# Patient Record
Sex: Female | Born: 1980 | Race: White | Hispanic: No | Marital: Single | Smoking: Former smoker
Health system: Southern US, Community
[De-identification: ages and names within clinical notes are randomized; demographics above are authoritative.]

## PROBLEM LIST (undated history)

## (undated) DIAGNOSIS — L509 Urticaria, unspecified: Secondary | ICD-10-CM

## (undated) DIAGNOSIS — J069 Acute upper respiratory infection, unspecified: Secondary | ICD-10-CM

## (undated) HISTORY — DX: Urticaria, unspecified: L50.9

## (undated) HISTORY — DX: Acute upper respiratory infection, unspecified: J06.9

---
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
## 2006-09-29 ENCOUNTER — Emergency Department (HOSPITAL_COMMUNITY): Admission: EM | Admit: 2006-09-29 | Discharge: 2006-09-29 | Payer: Self-pay | Admitting: Emergency Medicine

## 2008-08-26 ENCOUNTER — Encounter: Admission: RE | Admit: 2008-08-26 | Discharge: 2008-08-26 | Payer: Self-pay | Admitting: Gastroenterology

## 2008-12-14 ENCOUNTER — Emergency Department (HOSPITAL_COMMUNITY): Admission: EM | Admit: 2008-12-14 | Discharge: 2008-12-14 | Payer: Self-pay | Admitting: Emergency Medicine

## 2010-08-15 ENCOUNTER — Other Ambulatory Visit: Admission: RE | Admit: 2010-08-15 | Discharge: 2010-08-15 | Payer: Self-pay | Admitting: Family Medicine

## 2011-01-01 ENCOUNTER — Emergency Department (HOSPITAL_BASED_OUTPATIENT_CLINIC_OR_DEPARTMENT_OTHER)
Admission: EM | Admit: 2011-01-01 | Discharge: 2011-01-01 | Disposition: A | Discharge status: Home / Self Care | Payer: Managed Care, Other (non HMO) | Attending: Emergency Medicine | Admitting: Emergency Medicine

## 2011-01-01 DIAGNOSIS — N200 Calculus of kidney: Secondary | ICD-10-CM | POA: Insufficient documentation

## 2011-01-01 LAB — URINE MICROSCOPIC-ADD ON

## 2011-01-01 LAB — BASIC METABOLIC PANEL
Calcium: 9.3 mg/dL (ref 8.4–10.5)
Chloride: 107 mEq/L (ref 96–112)
GFR calc Af Amer: >60 mL/min (ref >60)
GFR calc non Af Amer: >60 mL/min (ref >60)
Glucose, Bld: 107 mg/dL — ABNORMAL HIGH (ref 70–99)

## 2011-01-01 LAB — URINALYSIS, ROUTINE W REFLEX MICROSCOPIC
Bilirubin Urine: NEGATIVE
Ketones, ur: NEGATIVE mg/dL
Leukocytes, UA: NEGATIVE
Urobilinogen, UA: 0.2 mg/dL (ref 0.0–1.0)

## 2011-01-01 LAB — PREGNANCY, URINE: Preg Test, Ur: NEGATIVE

## 2011-01-03 LAB — URINE CULTURE
Colony Count: 60000
Culture  Setup Time: 201203181201

## 2011-01-26 LAB — URINALYSIS, ROUTINE W REFLEX MICROSCOPIC: Specific Gravity, Urine: 1.018 (ref 1.005–1.030)

## 2011-01-26 LAB — URINE CULTURE: Colony Count: NO GROWTH

## 2011-02-22 ENCOUNTER — Other Ambulatory Visit: Payer: Self-pay | Admitting: Family Medicine

## 2011-02-22 DIAGNOSIS — R1011 Right upper quadrant pain: Secondary | ICD-10-CM

## 2011-02-28 ENCOUNTER — Ambulatory Visit
Admission: RE | Admit: 2011-02-28 | Discharge: 2011-02-28 | Disposition: A | Discharge status: Home / Self Care | Payer: Managed Care, Other (non HMO) | Source: Ambulatory Visit | Attending: Family Medicine | Admitting: Family Medicine

## 2011-02-28 DIAGNOSIS — R1011 Right upper quadrant pain: Secondary | ICD-10-CM

## 2011-07-19 IMAGING — US US ABDOMEN COMPLETE
1 series · 14 of 25 positions shown · non-contrast
Comparison: Abdominal CT 08/26/2008.

CLINICAL DATA: 29-year-old with epigastric and right upper
quadrant abdominal pain.

COMPLETE ABDOMINAL ULTRASOUND

[Series 1: us abdomen complete · 0.20mm/px · 14 of 84 slices shown]
[im 1/84]
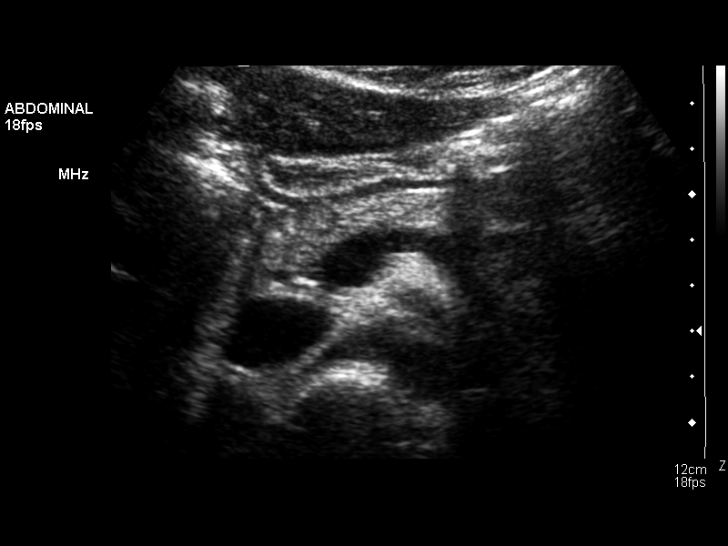
[im 7/84]
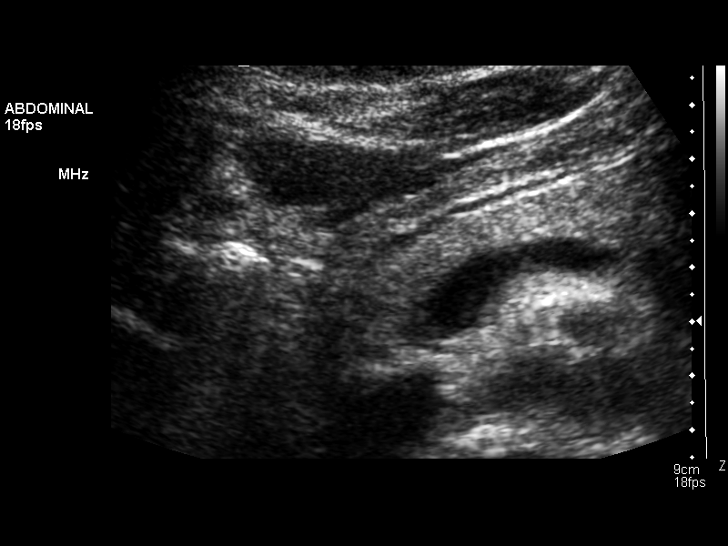
[im 14/84]
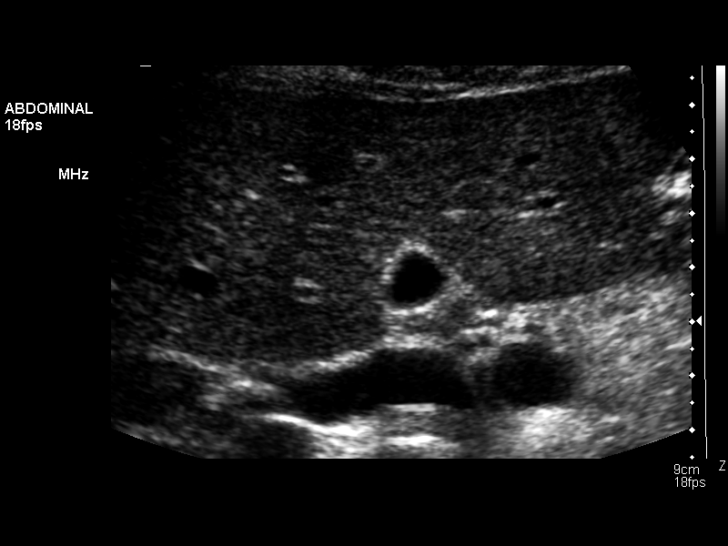
[im 21/84]
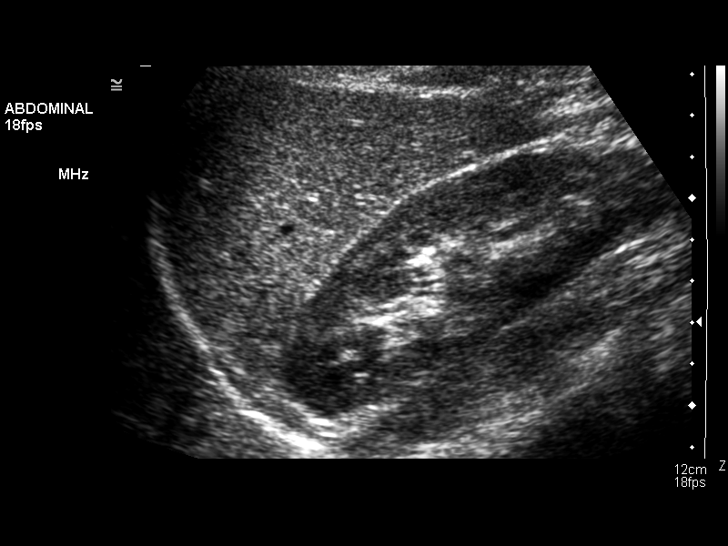
[im 28/84]
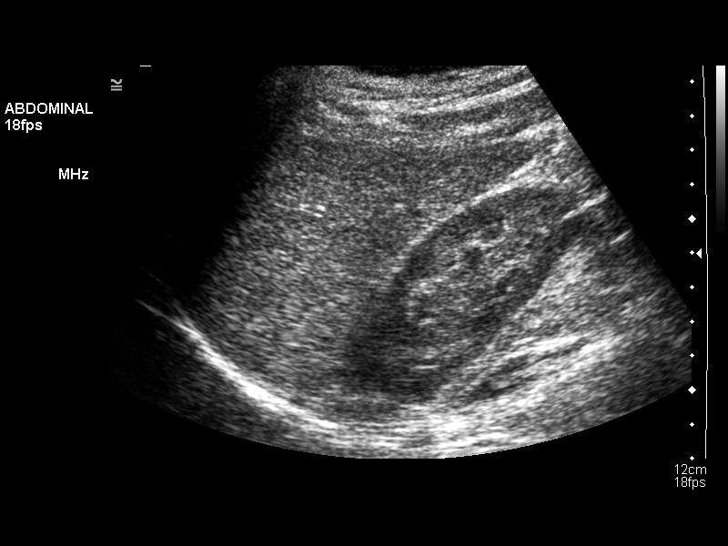
[im 32/84]
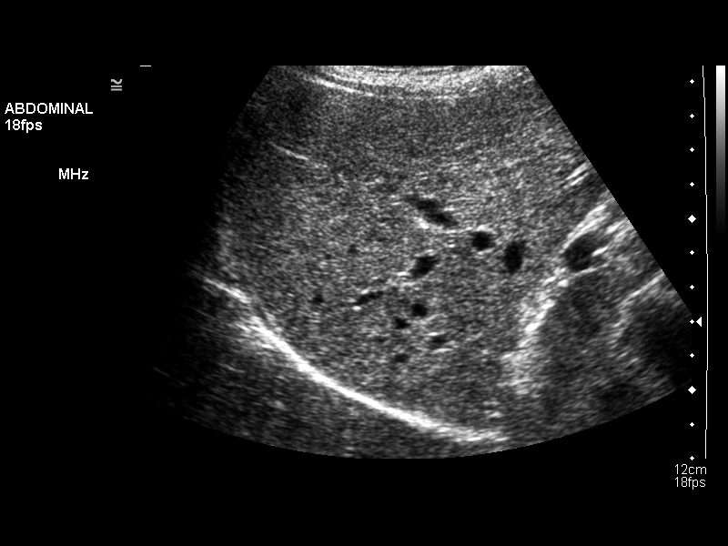
[im 39/84]
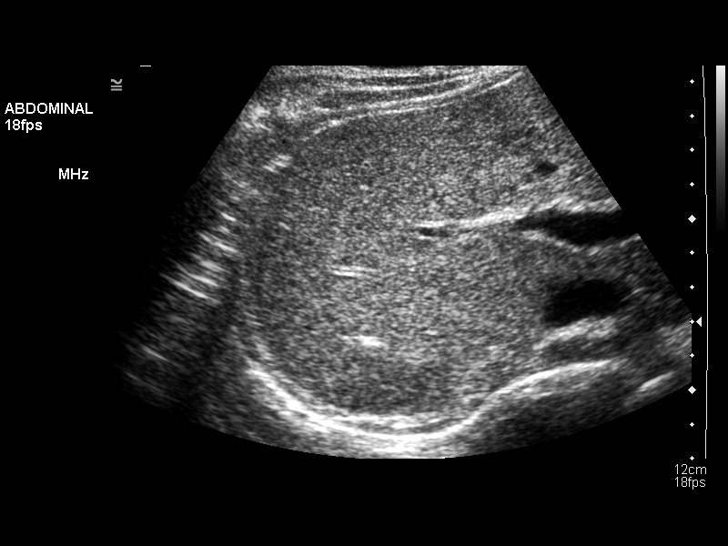
[im 45/84]
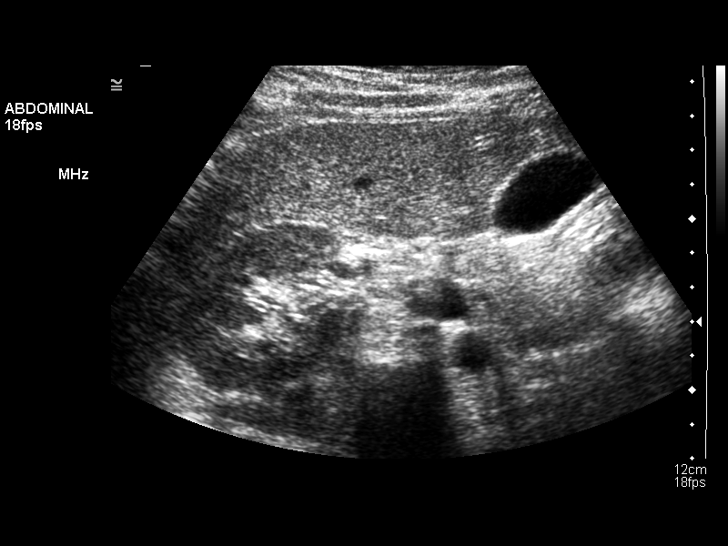
[im 52/84]
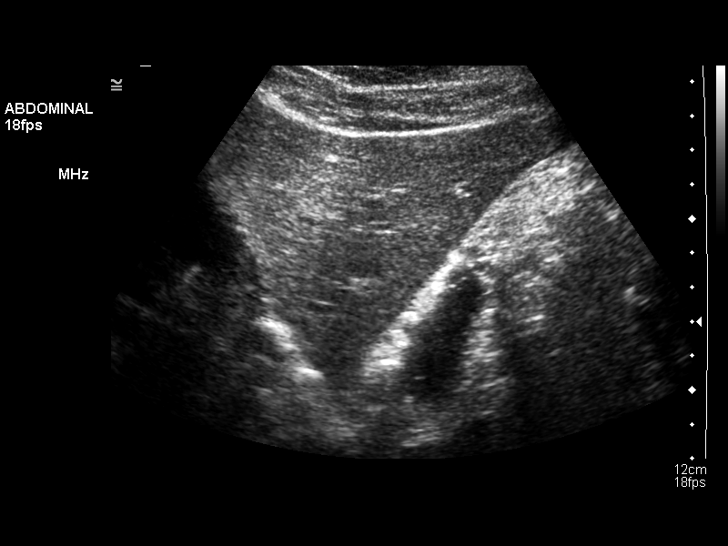
[im 56/84]
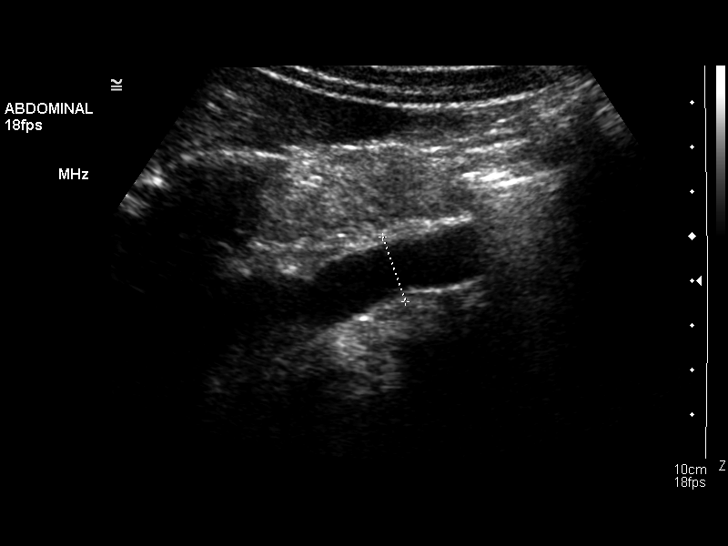
[im 63/84]
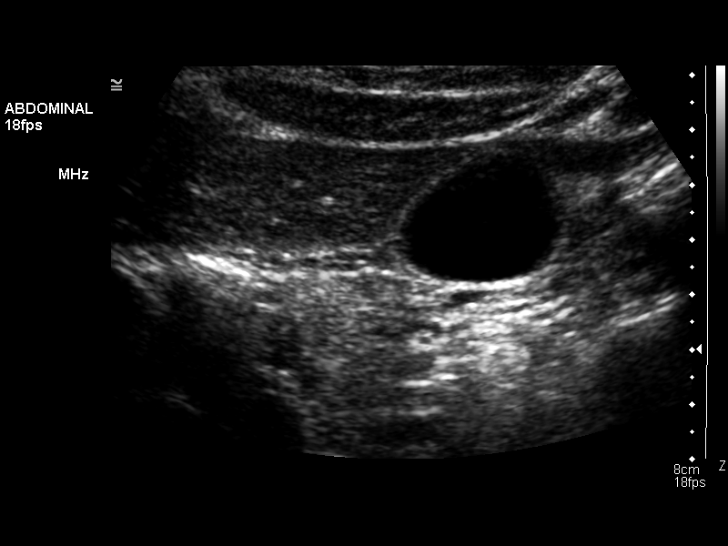
[im 70/84]
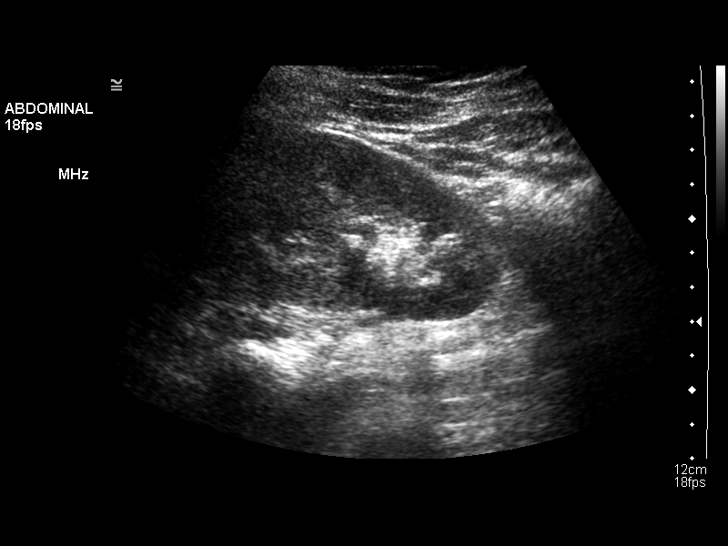
[im 77/84]
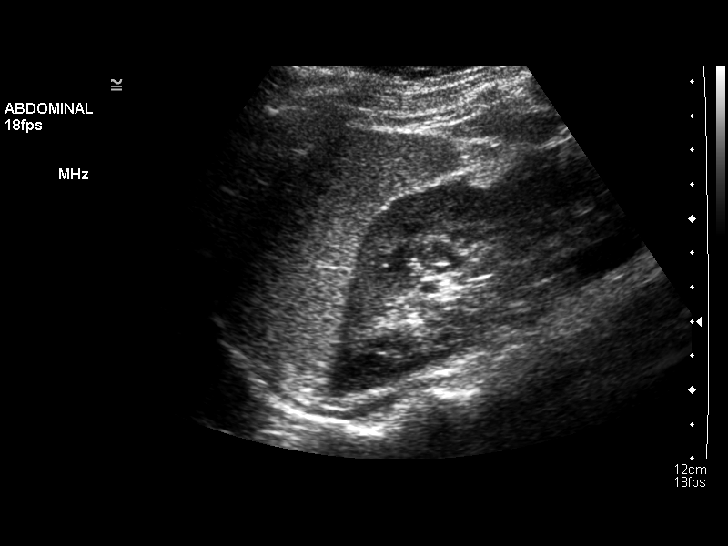
[im 84/84]
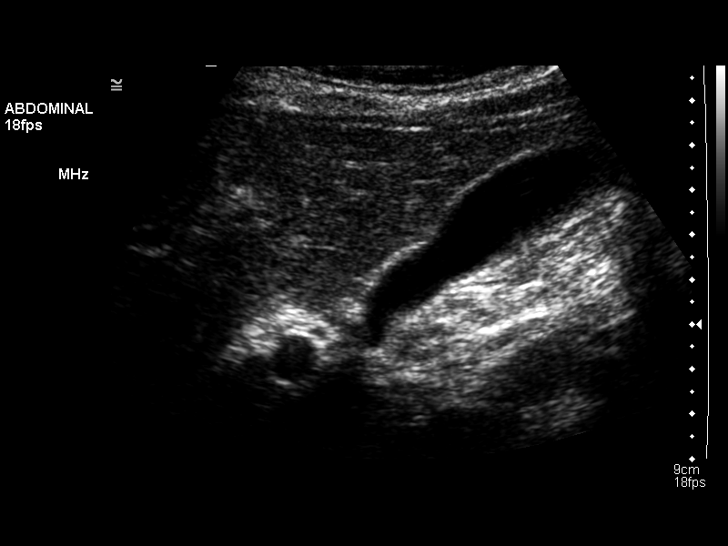

[14 of 25 positions shown; findings below may reference images not displayed]

FINDINGS: Gallbladder: Well distended without wall thickening, stones or
pericholecystic fluid. Negative sonographic Murphy's sign.

Common bile duct:   Normal in caliber without filling defects.

Liver:  Echogenicity is within normal limits.  No focal hepatic
abnormalities are identified.

IVC:  Visualized portions appear unremarkable.

Pancreas:  Visualized portions appear unremarkable.

Spleen:  Visualized portions appear unremarkable.

Right Kidney:   The renal cortical thickness and echogenicity are
preserved.  There is no hydronephrosis or focal abnormality. Renal
length is 11.4 cm.

Left Kidney:   The renal cortical thickness and echogenicity are
preserved.  There is no hydronephrosis or focal abnormality. Renal
length is 11.7 cm.

Abdominal aorta:  Visualized portions appear unremarkable.
IMPRESSION: Normal abdominal ultrasound.

## 2020-05-30 DIAGNOSIS — Z20822 Contact with and (suspected) exposure to covid-19: Secondary | ICD-10-CM | POA: Diagnosis not present

## 2020-05-31 DIAGNOSIS — Z20822 Contact with and (suspected) exposure to covid-19: Secondary | ICD-10-CM | POA: Diagnosis not present

## 2020-06-09 DIAGNOSIS — Z20822 Contact with and (suspected) exposure to covid-19: Secondary | ICD-10-CM | POA: Diagnosis not present

## 2020-07-22 ENCOUNTER — Other Ambulatory Visit: Payer: Self-pay | Admitting: Family Medicine

## 2020-07-22 ENCOUNTER — Other Ambulatory Visit (HOSPITAL_COMMUNITY)
Admission: RE | Admit: 2020-07-22 | Discharge: 2020-07-22 | Disposition: A | Discharge status: Home / Self Care | Payer: BC Managed Care – PPO | Source: Ambulatory Visit | Attending: Family Medicine | Admitting: Family Medicine

## 2020-07-22 DIAGNOSIS — Z13220 Encounter for screening for lipoid disorders: Secondary | ICD-10-CM | POA: Diagnosis not present

## 2020-07-22 DIAGNOSIS — Z124 Encounter for screening for malignant neoplasm of cervix: Secondary | ICD-10-CM | POA: Insufficient documentation

## 2020-07-22 DIAGNOSIS — Z23 Encounter for immunization: Secondary | ICD-10-CM | POA: Diagnosis not present

## 2020-07-22 DIAGNOSIS — Z0000 Encounter for general adult medical examination without abnormal findings: Secondary | ICD-10-CM | POA: Diagnosis not present

## 2020-07-22 DIAGNOSIS — E559 Vitamin D deficiency, unspecified: Secondary | ICD-10-CM | POA: Diagnosis not present

## 2020-07-22 DIAGNOSIS — Z113 Encounter for screening for infections with a predominantly sexual mode of transmission: Secondary | ICD-10-CM | POA: Diagnosis not present

## 2020-07-26 LAB — CYTOLOGY - PAP
Comment: NEGATIVE
Diagnosis: NEGATIVE
High risk HPV: NEGATIVE

## 2020-08-03 DIAGNOSIS — M545 Low back pain, unspecified: Secondary | ICD-10-CM | POA: Diagnosis not present

## 2020-08-03 DIAGNOSIS — M25512 Pain in left shoulder: Secondary | ICD-10-CM | POA: Diagnosis not present

## 2020-08-03 DIAGNOSIS — G8929 Other chronic pain: Secondary | ICD-10-CM | POA: Diagnosis not present

## 2020-08-03 DIAGNOSIS — M25511 Pain in right shoulder: Secondary | ICD-10-CM | POA: Diagnosis not present

## 2020-08-17 DIAGNOSIS — M25512 Pain in left shoulder: Secondary | ICD-10-CM | POA: Diagnosis not present

## 2020-08-17 DIAGNOSIS — M25511 Pain in right shoulder: Secondary | ICD-10-CM | POA: Diagnosis not present

## 2020-08-17 DIAGNOSIS — M545 Low back pain, unspecified: Secondary | ICD-10-CM | POA: Diagnosis not present

## 2020-08-24 DIAGNOSIS — M25511 Pain in right shoulder: Secondary | ICD-10-CM | POA: Diagnosis not present

## 2020-08-24 DIAGNOSIS — M25512 Pain in left shoulder: Secondary | ICD-10-CM | POA: Diagnosis not present

## 2020-08-24 DIAGNOSIS — M545 Low back pain, unspecified: Secondary | ICD-10-CM | POA: Diagnosis not present

## 2020-08-31 DIAGNOSIS — M25511 Pain in right shoulder: Secondary | ICD-10-CM | POA: Diagnosis not present

## 2020-08-31 DIAGNOSIS — M25512 Pain in left shoulder: Secondary | ICD-10-CM | POA: Diagnosis not present

## 2020-08-31 DIAGNOSIS — M545 Low back pain, unspecified: Secondary | ICD-10-CM | POA: Diagnosis not present

## 2020-09-07 DIAGNOSIS — M542 Cervicalgia: Secondary | ICD-10-CM | POA: Diagnosis not present

## 2020-09-14 DIAGNOSIS — M545 Low back pain, unspecified: Secondary | ICD-10-CM | POA: Diagnosis not present

## 2020-09-14 DIAGNOSIS — M25512 Pain in left shoulder: Secondary | ICD-10-CM | POA: Diagnosis not present

## 2020-09-14 DIAGNOSIS — M25511 Pain in right shoulder: Secondary | ICD-10-CM | POA: Diagnosis not present

## 2020-09-21 DIAGNOSIS — M545 Low back pain, unspecified: Secondary | ICD-10-CM | POA: Diagnosis not present

## 2020-09-21 DIAGNOSIS — M25512 Pain in left shoulder: Secondary | ICD-10-CM | POA: Diagnosis not present

## 2020-09-21 DIAGNOSIS — M25511 Pain in right shoulder: Secondary | ICD-10-CM | POA: Diagnosis not present

## 2020-09-30 DIAGNOSIS — M25511 Pain in right shoulder: Secondary | ICD-10-CM | POA: Diagnosis not present

## 2020-09-30 DIAGNOSIS — M25512 Pain in left shoulder: Secondary | ICD-10-CM | POA: Diagnosis not present

## 2020-09-30 DIAGNOSIS — M545 Low back pain, unspecified: Secondary | ICD-10-CM | POA: Diagnosis not present

## 2020-10-19 DIAGNOSIS — M545 Low back pain, unspecified: Secondary | ICD-10-CM | POA: Diagnosis not present

## 2020-10-19 DIAGNOSIS — M25512 Pain in left shoulder: Secondary | ICD-10-CM | POA: Diagnosis not present

## 2020-10-19 DIAGNOSIS — M25511 Pain in right shoulder: Secondary | ICD-10-CM | POA: Diagnosis not present

## 2020-11-09 DIAGNOSIS — R109 Unspecified abdominal pain: Secondary | ICD-10-CM | POA: Diagnosis not present

## 2020-11-09 DIAGNOSIS — J3089 Other allergic rhinitis: Secondary | ICD-10-CM | POA: Diagnosis not present

## 2020-12-03 DIAGNOSIS — M542 Cervicalgia: Secondary | ICD-10-CM | POA: Diagnosis not present

## 2020-12-09 DIAGNOSIS — R109 Unspecified abdominal pain: Secondary | ICD-10-CM | POA: Diagnosis not present

## 2020-12-13 DIAGNOSIS — M25512 Pain in left shoulder: Secondary | ICD-10-CM | POA: Diagnosis not present

## 2020-12-13 DIAGNOSIS — M25511 Pain in right shoulder: Secondary | ICD-10-CM | POA: Diagnosis not present

## 2020-12-13 DIAGNOSIS — M545 Low back pain, unspecified: Secondary | ICD-10-CM | POA: Diagnosis not present

## 2021-01-11 ENCOUNTER — Ambulatory Visit (INDEPENDENT_AMBULATORY_CARE_PROVIDER_SITE_OTHER): Payer: BC Managed Care – PPO | Admitting: Allergy & Immunology

## 2021-01-11 ENCOUNTER — Encounter: Payer: Self-pay | Admitting: Allergy & Immunology

## 2021-01-11 ENCOUNTER — Other Ambulatory Visit: Payer: Self-pay

## 2021-01-11 VITALS — BP 106/60 | HR 72 | Temp 98.5°F | Resp 14 | Ht 61.0 in | Wt 125.4 lb

## 2021-01-11 DIAGNOSIS — L239 Allergic contact dermatitis, unspecified cause: Secondary | ICD-10-CM

## 2021-01-11 DIAGNOSIS — H1045 Other chronic allergic conjunctivitis: Secondary | ICD-10-CM | POA: Diagnosis not present

## 2021-01-11 DIAGNOSIS — H109 Unspecified conjunctivitis: Secondary | ICD-10-CM | POA: Insufficient documentation

## 2021-01-11 DIAGNOSIS — J3089 Other allergic rhinitis: Secondary | ICD-10-CM

## 2021-01-11 MED ORDER — ZERVIATE 0.24 % OP SOLN: 1.0000 [drp] | Freq: Two times a day (BID) | OPHTHALMIC | 5 refills | Status: DC

## 2021-01-11 NOTE — Patient Instructions (Addendum)
1. Perennial allergic rhinitis - Testing was positive to indoor molds. - Avoidance measures provided. - Copy of testing results provided. - Continue with cetirizine 10 mg, but increase to 1 tablet twice daily. - Samples of Zerviate eye drops provided two use 1 drop per eye twice daily.  2. Possible contact dermatitis - Make an appointment for patch testing. - We use a panel that test for the most common chemical sensitizers. - You can also bring in your cosmetics from home and we can test those as well. - These patches are placed on a Monday and read on a Wednesday and Friday. - You can remain on the antihistamines during this time.  3. Multiple food intolerances - None of your reactions were concerning for anaphylaxis.  - Therefore, no testing today for foods was indicated. - Continue to avoid elevated triggering foods.  4. Return in about 2 weeks (around 01/25/2021) for [facility] TESTING.    Please inform us of any Emergency Department visits, hospitalizations, or changes in symptoms. Call us before going to the ED for breathing or allergy symptoms since we might be able to fit you in for a sick visit. Feel free to contact us anytime with any questions, problems, or concerns.  It was a pleasure to meet you today!  Websites that have reliable patient information: 1. American Academy of Asthma, Allergy, and Immunology: www.aaaai.org 2. Food Allergy Research and Education (FARE): foodallergy.org 3. Mothers of Asthmatics: http://www.asthmacommunitynetwork.org 4. American College of Allergy, Asthma, and Immunology: www.acaai.org   COVID-19 Vaccine Information can be found at: [email or web address] For questions related to vaccine distribution or appointments, please email [email or web address] or call [phone].   We realize that you might be concerned about having an allergic reaction to the COVID19 vaccines. To help with that  concern, WE ARE OFFERING THE COVID19 VACCINES IN OUR OFFICE! Ask the front desk for dates!     "Like" [place] on Facebook and Instagram for our latest updates!      A healthy democracy works best when [employer] participate! Make sure you are registered to vote! If you have moved or changed any of your contact information, you will need to get this updated before voting!  In some cases, you MAY be able to register to vote online: [email or web address]   1. Control-Buffer 50% Glycerol Negative   2. Control-Histamine 1 mg/ml 2+   3. Albumin saline Negative   4. Bahia Negative   5. French Southern Territories Negative   6. Johnson Negative   7. Kentucky Blue Negative   8. Meadow Fescue Negative   9. Perennial Rye Negative   10. Sweet Vernal Negative   11. Timothy Negative   12. Cocklebur Negative   13. Burweed Marshelder Negative   14. Ragweed, short Negative   15. Ragweed, Giant Negative   16. Plantain,  English Negative   17. Lamb's Quarters Negative   18. Sheep Sorrell Negative   19. Rough Pigweed Negative   20. Marsh Elder, Rough Negative   21. Mugwort, Common Negative   22. Ash mix Negative   23. Birch mix Negative   24. Beech American Negative   25. Box, Elder Negative   26. Cedar, red Negative   27. Cottonwood, Guinea-Bissau Negative   28. Elm mix Negative   29. Hickory Negative   30. Maple mix Negative   31. Oak, Guinea-Bissau mix Negative   32. Pecan Pollen Negative   33. Pine mix Negative   34. Sycamore Eastern Negative   35. Walnut, Black Pollen  Negative   36. Alternaria alternata Negative   37. Cladosporium Herbarum Negative   38. Aspergillus mix Negative   39. Penicillium mix Negative   40. Bipolaris sorokiniana (Helminthosporium) Negative   41. Drechslera spicifera (Curvularia) Negative   42. Mucor plumbeus Negative   43. Fusarium moniliforme Negative   44. Aureobasidium pullulans (pullulara) Negative   45. Rhizopus oryzae Negative   46. Botrytis cinera  Negative   47. Epicoccum nigrum Negative   48. Phoma betae Negative   49. Candida Albicans Negative   50. Trichophyton mentagrophytes Negative   51. Mite, D Farinae  5,000 AU/ml Negative   52. Mite, D Pteronyssinus  5,000 AU/ml Negative   53. Cat Hair 10,000 BAU/ml Negative   54.  Dog Epithelia Negative   55. Mixed Feathers Negative   56. Horse Epithelia Negative   57. Cockroach, German Negative   58. Mouse Negative   59. Tobacco Leaf Negative      Control Negative   French Southern Territories Negative   Johnson Negative   7 Grass Negative   Ragweed mix Negative   Weed mix Negative   Tree mix Negative   Mold 1 Negative   Mold 2 3+   Mold 3 Negative   Mold 4 2+   Cat Negative   Dog Negative   Cockroach Negative   Mite mix Negative     Control of Mold Allergen   Mold and fungi can grow on a variety of surfaces provided certain temperature and moisture conditions exist.  Outdoor molds grow on plants, decaying vegetation and soil.  The major outdoor mold, Alternaria and Cladosporium, are found in very high numbers during hot and dry conditions.  Generally, a late Summer - Fall peak is seen for common outdoor fungal spores.  Rain will temporarily lower outdoor mold spore count, but counts rise rapidly when the rainy period ends.  The most important indoor molds are Aspergillus and Penicillium.  Dark, humid and poorly ventilated basements are ideal sites for mold growth.  The next most common sites of mold growth are the bathroom and the kitchen.   Indoor (Perennial) Mold Control   Positive indoor molds via skin testing: Aspergillus, Penicillium, Fusarium, Aureobasidium (Pullulara) and Rhizopus  1. Maintain humidity below 50%. 2. Clean washable surfaces with 5% bleach solution. 3. Remove sources e.g. contaminated carpets.

## 2021-01-11 NOTE — Progress Notes (Signed)
NEW PATIENT  Date of Service/Encounter:  01/11/21  Referring provider: Wilfrid Lund, PA   Assessment:   Perennial allergic rhinitis (indoor molds)  Chronic allergic conjunctivitis of both eyes  Allergic contact dermatis - recommended patch testing   Unfortunately, her testing today does not correlate with her symptoms.  She might have some mold in her home, but her eye issues do not seem to be any worse there than they are when she is out and about.  In particular, they tend to worsen when she is outdoors.  We are going to get patch testing to see if maybe she is reacting to one of her facial cleansers or make-up.  I do not think this is related to her contacts, as she has gone a few days without the contacts and has not had any worsening symptoms.  We are going to up her cetirizine to see if this helps at all.  We also gave her a sample of an eyedrop Zerviate to see if this helps at all.  Plan/Recommendations:   1. Perennial allergic rhinitis - Testing was positive to indoor molds. - Avoidance measures provided. - Copy of testing results provided. - Continue with cetirizine 10 mg, but increase to 1 tablet twice daily. - Samples of Zerviate eye drops provided two use 1 drop per eye twice daily.  2. Possible contact dermatitis - Make an appointment for patch testing. - We use a panel that test for the most common chemical sensitizers. - You can also bring in your cosmetics from home and we can test those as well. - These patches are placed on a Monday and read on a Wednesday and Friday. - You can remain on the antihistamines during this time.  3. Multiple food intolerances - None of your reactions were concerning for anaphylaxis.  - Therefore, no testing today for foods was indicated. - Continue to avoid elevated triggering foods.  4. Return in about 2 weeks (around 01/25/2021) for [facility] TESTING.    Subjective:   Eileen Cherry is a 40 y.o. female presenting today for  evaluation of  Chief Complaint  Patient presents with  . Allergic Reaction    Diagnosed with allergies when she was 40 years old. Eyes watery and burning, runny nose. Allergies cause her daily routine to be interrupted. Sneezing and coughing, itchy skin.     [name] has a history of the following: Patient Active Problem List   Diagnosis Date Noted  . Perennial allergic rhinitis 01/11/2021  . Conjunctivitis 01/11/2021    History obtained from: chart review and patient.  Denice Bors was referred by Wilfrid Lund, PA.     Eileen Cherry is a 40 y.o. female presenting for an evaluation of allergies and conjnuctivitis.   Allergic Rhinitis Symptom History: She reports that she has sneezing and coughing. Her main complaint today is burning itchy watery eyes. She recently tried some eye drops. She has gotten rid of some facial lotion that burns her eyes. Her eyes have continued to burn despite having lotions. Symptoms really started late summer or early fall 2021. Symptoms continued through the winter as well. The burning is a new symptom. Eye lids are never goopy and never red. She has been on cetirizine more consistently than Pataday. This has been on board for one month consistently. She got a cat in July 2020.   She started Pataday around one month ago and she has not had a huge flare up since that time. She can go  months or weeks without anything happening. The last really bad situation was in March 2022. She denies any nasal congestion, but more with rhinorrhea. The cetirizine has helped with all of the drippiness, but there is some breakthrough symptoms. She grew up in [facility] [place] [facility] and this is where her symptoms have been the worse.   She denies any pain with eye movement.  She denies any photophobia.  She denies headache.   Food Allergy Symptom History: She has been having some GI issues. She has had negative testing in the past. She has been having diarrhea immediately after eating. There is  some burning with stooling as well and feels that she is "digesting fiberglass". She has seen GI in the past, but not recently.   She denies any sensitivity to metals.  She denies any other contact dermatitis history.  She does have some products that she uses on her face, but her conjunctivitis episodes never seem to correlate with this.  She has had episodes where she has conjunctivitis and has not been using her make-up or face wash.  She also does use contacts.  She uses daily contacts that are replaced consistently every day.  Again, she has symptoms that do not correlate with the use of her contacts.  She does use her glasses occasionally and continues to have the eye issues.  She is open to patch testing, however.  Otherwise, there is no history of other atopic diseases, including asthma, drug allergies, stinging insect allergies, eczema, urticaria or contact dermatitis. There is no significant infectious history. Vaccinations are up to date.    Past Medical History: Patient Active Problem List   Diagnosis Date Noted  . Perennial allergic rhinitis 01/11/2021  . Conjunctivitis 01/11/2021    Medication List:  Allergies as of 01/11/2021      Reactions   Amoxicillin Rash   Other reaction(s): Rash      Medication List       Accurate as of January 11, 2021 11:00 AM. If you have any questions, ask your nurse or doctor.        AZO COMPLETE FEMININE BALANCE PO Take by mouth.   cetirizine 10 MG tablet Commonly known as: ZYRTEC 1 tablet   meloxicam 15 MG tablet Commonly known as: MOBIC 1 tablet       Birth History: non-contributory  Developmental History: non-contributory  Past Surgical History: Past Surgical History:  Procedure Laterality Date  . THERAPEUTIC ABORTION  2003     Family History: History reviewed. No pertinent family history.   Social History: Eileen Cherry lives at home with her cat. She lives in a an apartment that was built in the 1970s.  There is probably  water damage.  She has carpeting throughout the home.  She has electric heating and central cooling.  There is 1 cat inside of the home who does not sleep in the bedroom.  There are no dust mite covers on the bedding.  She does have a lot of neighbors that smoke, but she does not smoke herself.  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  She does not use a HEPA filter in the home.  [place]     Review of Systems  Constitutional: Negative.  Negative for fever, malaise/fatigue and weight loss.  HENT: Negative.  Negative for congestion, ear discharge and ear pain.   Eyes: Positive for discharge and redness.  Negative for pain.  Respiratory: Negative for cough, sputum production, shortness of breath and wheezing.   Cardiovascular: Negative.  Negative for chest pain and palpitations.  Gastrointestinal: Negative for abdominal pain, diarrhea, heartburn, nausea and vomiting.  Skin: Negative.  Negative for itching and rash.  Neurological: Negative for dizziness and headaches.  Endo/Heme/Allergies: Negative for environmental allergies. Does not bruise/bleed easily.       Objective:   Blood pressure 106/60, pulse 72, temperature 98.5 F (36.9 C), resp. rate 14, height 5\' 1"  (1.549 m), weight 125 lb 6.4 oz (56.9 kg), SpO2 97 %. Body mass index is 23.69 kg/m.   Physical Exam:   Physical Exam Constitutional:      Appearance: She is well-developed.     Comments: Very pleasant female.  HENT:     Head: Normocephalic and atraumatic.     Right Ear: Tympanic membrane, ear canal and external ear normal. No drainage, swelling or tenderness. Tympanic membrane is not injected, scarred, erythematous, retracted or bulging.     Left Ear: Tympanic membrane, ear canal and external ear normal. No drainage, swelling or tenderness. Tympanic membrane is not injected, scarred, erythematous, retracted or  bulging.     Nose: No nasal deformity, septal deviation, mucosal edema or rhinorrhea.     Right Turbinates: Enlarged and swollen.     Left Turbinates: Enlarged.     Right Sinus: No maxillary sinus tenderness or frontal sinus tenderness.     Left Sinus: No maxillary sinus tenderness or frontal sinus tenderness.     Mouth/Throat:     Mouth: Mucous membranes are not pale and not dry.     Pharynx: Uvula midline.     Comments: Mild cobblestoning. Eyes:     General: Allergic shiner present.        Right eye: No discharge.        Left eye: No discharge.     Conjunctiva/sclera: Conjunctivae normal.     Right eye: Right conjunctiva is not injected. No chemosis.    Left eye: Left conjunctiva is not injected. No chemosis.    Pupils: Pupils are equal, round, and reactive to light.     Comments: Eyes appear clear today.  She does have contacts in place bilaterally.  There is no dermatitis of the eyelids or conjunctivitis.  Cardiovascular:     Rate and Rhythm: Normal rate and regular rhythm.     Heart sounds: Normal heart sounds.  Pulmonary:     Effort: Pulmonary effort is normal. No tachypnea, accessory muscle usage or respiratory distress.     Breath sounds: Normal breath sounds. No wheezing, rhonchi or rales.     Comments: Moving air well in all lung fields.  No increased work of breathing. Chest:     Chest wall: No tenderness.  Abdominal:     Tenderness: There is no abdominal tenderness. There is no guarding or rebound.  Lymphadenopathy:     Head:     Right side of head: No submandibular, tonsillar or occipital adenopathy.     Left side of head: No submandibular, tonsillar or occipital adenopathy.     Cervical: No cervical adenopathy.  Skin:    General: Skin is warm.     Capillary Refill: Capillary refill takes less than 2 seconds.     Coloration: Skin is not pale.     Findings: No abrasion, erythema, petechiae or rash. Rash is not papular, urticarial or vesicular.     Comments: She  does have rather fair skin.  Neurological:  Mental Status: She is alert.  Psychiatric:        Behavior: Behavior is cooperative.      Diagnostic studies:      Allergy Studies:     Airborne Adult Perc - 01/11/21 0935    Time Antigen Placed 0935    Allergen Manufacturer Waynette Buttery    Location Back    Number of Test 59    Panel 1 Select    1. Control-Buffer 50% Glycerol Negative    2. Control-Histamine 1 mg/ml 2+    3. Albumin saline Negative    4. Bahia Negative    5. French Southern Territories Negative    6. Johnson Negative    7. Kentucky Blue Negative    8. Meadow Fescue Negative    9. Perennial Rye Negative    10. Sweet Vernal Negative    11. Timothy Negative    12. Cocklebur Negative    13. Burweed Marshelder Negative    14. Ragweed, short Negative    15. Ragweed, Giant Negative    16. Plantain,  English Negative    17. Lamb's Quarters Negative    18. Sheep Sorrell Negative    19. Rough Pigweed Negative    20. Marsh Elder, Rough Negative    21. Mugwort, Common Negative    22. Ash mix Negative    23. Birch mix Negative    24. Beech American Negative    25. Box, Elder Negative    26. Cedar, red Negative    27. Cottonwood, Guinea-Bissau Negative    28. Elm mix Negative    29. Hickory Negative    30. Maple mix Negative    31. Oak, Guinea-[place]issau mix Negative    32. Pecan Pollen Negative    33. Pine mix Negative    34. Sycamore Eastern Negative    35. Walnut, Black Pollen Negative    36. Alternaria alternata Negative    37. Cladosporium Herbarum Negative    38. Aspergillus mix Negative    39. Penicillium mix Negative    40. Bipolaris sorokiniana (Helminthosporium) Negative    41. Drechslera spicifera (Curvularia) Negative    42. Mucor plumbeus Negative    43. Fusarium moniliforme Negative    44. Aureobasidium pullulans (pullulara) Negative    45. Rhizopus oryzae Negative    46. Botrytis cinera Negative    47. Epicoccum nigrum Negative    48. Phoma betae Negative    49. Candida  Albicans Negative    50. Trichophyton mentagrophytes Negative    51. Mite, D Farinae  5,000 AU/ml Negative    52. Mite, D Pteronyssinus  5,000 AU/ml Negative    53. Cat Hair 10,000 BAU/ml Negative    54.  Dog Epithelia Negative    55. Mixed Feathers Negative    56. Horse Epithelia Negative    57. Cockroach, German Negative    58. Mouse Negative    59. Tobacco Leaf Negative          Intradermal - 01/11/21 1010    Time Antigen Placed 1010    Allergen Manufacturer Greer    Location Arm    Number of Test 15    Control Negative    French Southern Territories Negative    Johnson Negative    7 Grass Negative    Ragweed mix Negative    Weed mix Negative    Tree mix Negative    Mold 1 Negative    Mold 2 3+    Mold 3 Negative    Mold 4  2+    Cat Negative    Dog Negative    Cockroach Negative    Mite mix Negative           Allergy testing results were read and interpreted by myself, documented by clinical staff.         Malachi BondsJoel Tyeesha Riker, MD [facility]

## 2021-01-21 DIAGNOSIS — Z03818 Encounter for observation for suspected exposure to other biological agents ruled out: Secondary | ICD-10-CM | POA: Diagnosis not present

## 2021-01-21 DIAGNOSIS — J069 Acute upper respiratory infection, unspecified: Secondary | ICD-10-CM | POA: Diagnosis not present

## 2021-01-24 ENCOUNTER — Other Ambulatory Visit: Payer: Self-pay

## 2021-01-24 ENCOUNTER — Encounter: Payer: Self-pay | Admitting: Family

## 2021-01-24 ENCOUNTER — Ambulatory Visit (INDEPENDENT_AMBULATORY_CARE_PROVIDER_SITE_OTHER): Payer: BC Managed Care – PPO | Admitting: Family

## 2021-01-24 VITALS — BP 116/78 | HR 80 | Temp 97.6°F | Resp 16 | Ht 61.0 in | Wt 123.0 lb

## 2021-01-24 DIAGNOSIS — L239 Allergic contact dermatitis, unspecified cause: Secondary | ICD-10-CM | POA: Diagnosis not present

## 2021-01-24 NOTE — Progress Notes (Signed)
Follow-up Note  RE: Eileen Cherry MRN: [id number] DOB: 08-22-1981 Date of Office Visit: 01/24/2021  Primary care provider: Wilfrid Lund, PA Referring provider: Wilfrid Lund, PA   Eileen Cherry returns to the office today for the patch test placement, given suspected history of contact dermatitis.    Diagnostics: True Test patches placed.    Plan:   Allergic contact dermatitis - Instructions provided on care of the patches for the next 48 hours. Eileen Cherry was instructed to avoid showering for the next 48 hours. Eileen Cherry will follow up in 48 hours and 96 hours for patch readings.  Please do not hesitate to contact us with any questions.  Thank you, Nehemiah Settle, FNP [facility]

## 2021-01-26 ENCOUNTER — Ambulatory Visit: Payer: BC Managed Care – PPO | Admitting: Family Medicine

## 2021-01-26 ENCOUNTER — Encounter: Payer: Self-pay | Admitting: Family Medicine

## 2021-01-26 ENCOUNTER — Other Ambulatory Visit: Payer: Self-pay

## 2021-01-26 ENCOUNTER — Telehealth: Payer: Self-pay | Admitting: *Deleted

## 2021-01-26 ENCOUNTER — Other Ambulatory Visit: Payer: Self-pay | Admitting: *Deleted

## 2021-01-26 DIAGNOSIS — L253 Unspecified contact dermatitis due to other chemical products: Secondary | ICD-10-CM

## 2021-01-26 NOTE — Patient Instructions (Addendum)
TRUE TEST 48-hour hour reading: positive reaction to #1 (Nickel Sulfate) and possible reaction to #9 (Paraben mix)  Plan:   Allergic contact dermatitis - The patient has been provided detailed information regarding the substances she is sensitive to, as well as products containing the substances.   - Meticulous avoidance of these substances is recommended after the patch testing has been completed.  - If avoidance is not possible, the use of barrier creams or lotions is recommended. - If symptoms persist or progress despite meticulous avoidance of the substances listed above, a dermatology referral may be warranted. - Take a picture of your back from several different angles on Friday and bring the picture to the clinic at your appointment time on Monday. Please call the clinic with any questions.   Call the clinic if this treatment plan is not working well for you  Follow up on Monday or sooner if needed.

## 2021-01-26 NOTE — Progress Notes (Signed)
    Follow-up Note  RE: Karin Pinedo MRN: [id number] DOB: 1980/11/17 Date of Office Visit: 01/26/2021  Primary care provider: Wilfrid Lund, PA Referring provider: Wilfrid Lund, PA   Eileen Cherry returns to the office today for the initial patch test interpretation, given suspected history of contact dermatitis.   Diagnostics:   TRUE TEST 48-hour hour reading: positive reaction to #1 (Nickel Sulfate) and possible reaction to #9 (Paraben mix)  Plan:   Allergic contact dermatitis - The patient has been provided detailed information regarding the substances she is sensitive to, as well as products containing the substances.   - Meticulous avoidance of these substances is recommended.  - If avoidance is not possible, the use of barrier creams or lotions is recommended after the patch testing has been completed - If symptoms persist or progress despite meticulous avoidance of the substances listed above, a dermatology referral may be warranted. - Take a picture of your back from several different angles on Friday and bring the picture to the clinic at your appointment time on Monday. Please call the clinic with any questions   Thank you for the opportunity to care for this patient.  Please do not hesitate to contact me with questions.  Call the clinic if this treatment plan is not working well for you  Follow up on Monday or sooner if needed.  Thermon Leyland, FNP [facility]

## 2021-01-26 NOTE — Telephone Encounter (Signed)
Received a fax from the pharmacy stating that the Zerviate eye drops are on manufacturer back order. Do you want to send an alternative or just continue with samples?g

## 2021-01-26 NOTE — Telephone Encounter (Signed)
We could try Pataday 1 drop per eye twice daily or Pazeo 1 drop per eye daily.  Maybe she has tried these.  If she has tried these, we can do Zaditor 1 drop per eye twice daily as needed.  I think we have enough samples to keep her going on it.  Malachi Bonds, MD [facility]

## 2021-01-27 NOTE — Telephone Encounter (Signed)
Left a message for the patient to give our office a call back.

## 2021-01-31 ENCOUNTER — Ambulatory Visit: Payer: BC Managed Care – PPO | Admitting: Family

## 2021-01-31 ENCOUNTER — Encounter: Payer: Self-pay | Admitting: Family

## 2021-01-31 ENCOUNTER — Other Ambulatory Visit: Payer: Self-pay

## 2021-01-31 VITALS — Temp 98.0°F

## 2021-01-31 DIAGNOSIS — L253 Unspecified contact dermatitis due to other chemical products: Secondary | ICD-10-CM

## 2021-01-31 NOTE — Progress Notes (Signed)
Marletta returns to the office today for the final patch test interpretation, given suspected history of contact dermatitis. She brought pictures of her back that she took on Friday, since our office was closed.   Diagnostics:   TRUE TEST 96-hour hour reading: 2+ (strong positive reaction) to nickel sulfate  Plan:   Allergic contact dermatitis - The patient has been provided detailed information regarding the substances she is sensitive to, as well as products containing the substances.   - Meticulous avoidance of these substances is recommended.  - If avoidance is not possible, the use of barrier creams or lotions is recommended. - If symptoms persist or progress despite meticulous avoidance of nickel sulfate, Dermatology Referral may be warranted.  Thank you, Nehemiah Settle, FNP  [facility]

## 2021-03-09 DIAGNOSIS — L209 Atopic dermatitis, unspecified: Secondary | ICD-10-CM | POA: Diagnosis not present

## 2021-03-22 DIAGNOSIS — Z20822 Contact with and (suspected) exposure to covid-19: Secondary | ICD-10-CM | POA: Diagnosis not present

## 2021-06-01 DIAGNOSIS — Z20822 Contact with and (suspected) exposure to covid-19: Secondary | ICD-10-CM | POA: Diagnosis not present

## 2021-08-08 DIAGNOSIS — H01002 Unspecified blepharitis right lower eyelid: Secondary | ICD-10-CM | POA: Diagnosis not present

## 2021-08-08 DIAGNOSIS — L089 Local infection of the skin and subcutaneous tissue, unspecified: Secondary | ICD-10-CM | POA: Diagnosis not present

## 2021-08-08 DIAGNOSIS — H01005 Unspecified blepharitis left lower eyelid: Secondary | ICD-10-CM | POA: Diagnosis not present

## 2021-08-11 ENCOUNTER — Emergency Department (HOSPITAL_BASED_OUTPATIENT_CLINIC_OR_DEPARTMENT_OTHER)
Admission: EM | Admit: 2021-08-11 | Discharge: 2021-08-11 | Disposition: A | Discharge status: Home / Self Care | Payer: BC Managed Care – PPO | Attending: Emergency Medicine | Admitting: Emergency Medicine

## 2021-08-11 ENCOUNTER — Other Ambulatory Visit: Payer: Self-pay

## 2021-08-11 ENCOUNTER — Encounter (HOSPITAL_BASED_OUTPATIENT_CLINIC_OR_DEPARTMENT_OTHER): Payer: Self-pay

## 2021-08-11 DIAGNOSIS — R21 Rash and other nonspecific skin eruption: Secondary | ICD-10-CM | POA: Diagnosis not present

## 2021-08-11 DIAGNOSIS — Z87891 Personal history of nicotine dependence: Secondary | ICD-10-CM | POA: Insufficient documentation

## 2021-08-11 DIAGNOSIS — H0101A Ulcerative blepharitis right eye, upper and lower eyelids: Secondary | ICD-10-CM | POA: Diagnosis not present

## 2021-08-11 NOTE — ED Provider Notes (Signed)
[facility] EMERGENCY DEPARTMENT Provider Note   CSN: [id number] Arrival date & time: 08/11/21  1745     History Chief Complaint  Patient presents with  . Eye Problem    Eileen Cherry is a 40 y.o. female presenting to the ED with a chief complaint of eye problem.  On 08/02/2021 noticed a bump on her lower eyelids.  Since then it has progressed to irritation and several more pustules.  On 08/07/2021 she followed up with her PCP and was given Bactrim as well as erythromycin ointment.  She saw her eye doctor today and was prescribed Lotemax ophthalmic drops which she took 1 dose of today.  However she was concerned that the symptoms have not improved so she presented to the ER.  She is mostly concerned about a small pustule in her left lower eyelid because it is close to her eye.  She denies any vision changes, pain with EOMs.  No trigger that she is aware of such as a new environmental exposure, new soap, lotion or make-up product.  She does admit that about a decade ago she had similar pustules in her genital area but tested negative for herpes several times at the time and this improved without intervention.  She does not state that this area is particularly painful or burning but her biggest concern is this pustule in her left lower eyelid.  Denies any contact lens or corrective lens use no fever, congestion   Eye Problem Associated symptoms: no discharge, no itching and no photophobia       Past Medical History:  Diagnosis Date  . Recurrent upper respiratory infection (URI)     Patient Active Problem List   Diagnosis Date Noted  . Perennial allergic rhinitis 01/11/2021  . Conjunctivitis 01/11/2021    Past Surgical History:  Procedure Laterality Date  . THERAPEUTIC ABORTION  2003     OB History   No obstetric history on file.     History reviewed. No pertinent family history.  Social History   Tobacco Use  . Smoking status: Former    Types: Cigarettes     Quit date: 2009    Years since quitting: 13.8  . Smokeless tobacco: Never  Vaping Use  . Vaping Use: Never used  Substance Use Topics  . Alcohol use: Yes    Comment: Social  . Drug use: Never    Home Medications Prior to Admission medications   Medication Sig Start Date End Date Taking? Authorizing Provider  cetirizine (ZYRTEC) 10 MG tablet 1 tablet Patient not taking: Reported on 01/24/2021 11/09/20   [provider]  Cetirizine HCl (ZERVIATE) 0.24 % SOLN Apply 1 drop to eye in the morning and at bedtime. Patient not taking: Reported on 01/24/2021 01/11/21   Alfonse Spruce, MD  fluticasone [facility]) 50 MCG/ACT nasal spray Place into both nostrils daily.    [provider]  Lactobacillus (AZO COMPLETE FEMININE BALANCE PO) Take by mouth.    [provider]  meloxicam (MOBIC) 15 MG tablet 1 tablet 09/07/20   [provider]  Multiple Vitamin (MULTI VITAMIN) TABS 1 tablet    [provider]    Allergies    Pataday [olopatadine hcl] and Amoxicillin  Review of Systems   Review of Systems  Constitutional:  Negative for chills and fever.  Eyes:  Negative for photophobia, discharge, itching and visual disturbance.  Skin:  Positive for rash.   Physical Exam Updated Vital Signs BP 131/84 (BP Location: Right Arm)  Pulse 95   Temp 98.5 F (36.9 C) (Oral)   Resp 20   Ht 5\' 1"  (1.549 m)   Wt 54.4 kg   LMP 07/31/2021 (Exact Date)   SpO2 98%   BMI 22.67 kg/m   Physical Exam Vitals and nursing note reviewed.  Constitutional:      General: She is not in acute distress.    Appearance: She is well-developed. She is not diaphoretic.  HENT:     Head: Normocephalic and atraumatic.  Eyes:     General: No scleral icterus.    Conjunctiva/sclera: Conjunctivae normal.     Pupils: Pupils are equal, round, and reactive to light.     Comments: Pustules noted to bilateral lower eyelids and upper eyelids as noted in the image.  Pulmonary:      Effort: Pulmonary effort is normal. No respiratory distress.  Musculoskeletal:     Cervical back: Normal range of motion.  Skin:    Findings: No rash.  Neurological:     Mental Status: She is alert.       ED Results / Procedures / Treatments   Labs (all labs ordered are listed, but only abnormal results are displayed) Labs Reviewed - No data to display  EKG None  Radiology No results found.  Procedures Procedures   Medications Ordered in ED Medications - No data to display  ED Course  I have reviewed the triage vital signs and the nursing notes.  Pertinent labs & imaging results that were available during my care of the patient were reviewed by me and considered in my medical decision making (see chart for details).    MDM Rules/Calculators/A&P                           40 year old female presenting to the ED for rash around her eye.  Symptoms began on 08/02/2021 and now progressed.  See imaging above.  She saw her eye doctor today and was prescribed Lotemax ophthalmic ointment.  She was told to come to the ER due to a pustule in her left lower inner eyelid.  She denies any vision changes.  Her vital signs within normal limits.  I am unsure what exactly is causing her symptoms however I feel reassured that she was evaluated by her eye doctor today.  I feel that she will also benefit from dermatology follow-up for further work-up such as a biopsy.  In the meantime we will have her continue the Lotemax ointment.  Offered antiviral medication due to the vesicular appearance but she declines.  Patient discussed with my attending, Dr. 08/04/2021 who reviewed imaging.  Patient is agreeable to the plan.  Return precautions given.    Patient is hemodynamically stable, in NAD, and able to ambulate in the ED. Evaluation does not show pathology that would require ongoing emergent intervention or inpatient treatment. I explained the diagnosis to the patient. Pain has been managed and has no  complaints prior to discharge. Patient is comfortable with above plan and is stable for discharge at this time. All questions were answered prior to disposition. Strict return precautions for returning to the ED were discussed. Encouraged follow up with PCP.   An After Visit Summary was printed and given to the patient.   Portions of this note were generated with Silverio Lay. Dictation errors may occur despite best attempts at proofreading.  Final Clinical Impression(s) / ED Diagnoses Final diagnoses:  Rash and nonspecific skin eruption  Rx / DC Orders ED Discharge Orders     None        Dietrich Pates, PA-C 08/11/21 2254    Charlynne Pander, MD 08/11/21 2322

## 2021-08-11 NOTE — Discharge Instructions (Addendum)
Follow-up with the dermatologist listed below. Continue taking the steroid ointment given by her eye doctor today. Use a warm compress as needed. Return to the ER if you start to develop a fever, blurry vision.

## 2021-08-11 NOTE — ED Triage Notes (Signed)
Pt had a bump near her eye on 10/18. Been seen by a provider & eye doctor . Rash has worsened. Denies itching or vision changes. Given Erythromycin eye ointment and Sulfamethoxazole abx. States has new bump on bottom eyelid that she is concerned about.

## 2021-08-12 DIAGNOSIS — L710 Perioral dermatitis: Secondary | ICD-10-CM | POA: Diagnosis not present

## 2021-08-12 DIAGNOSIS — L708 Other acne: Secondary | ICD-10-CM | POA: Diagnosis not present

## 2021-08-30 DIAGNOSIS — Z23 Encounter for immunization: Secondary | ICD-10-CM | POA: Diagnosis not present

## 2021-08-30 DIAGNOSIS — L710 Perioral dermatitis: Secondary | ICD-10-CM | POA: Diagnosis not present

## 2021-08-30 DIAGNOSIS — H01132 Eczematous dermatitis of right lower eyelid: Secondary | ICD-10-CM | POA: Diagnosis not present

## 2021-09-01 DIAGNOSIS — J3089 Other allergic rhinitis: Secondary | ICD-10-CM | POA: Diagnosis not present

## 2021-09-01 DIAGNOSIS — M542 Cervicalgia: Secondary | ICD-10-CM | POA: Diagnosis not present

## 2021-09-01 DIAGNOSIS — E559 Vitamin D deficiency, unspecified: Secondary | ICD-10-CM | POA: Diagnosis not present

## 2021-09-01 DIAGNOSIS — M545 Low back pain, unspecified: Secondary | ICD-10-CM | POA: Diagnosis not present

## 2021-09-05 ENCOUNTER — Ambulatory Visit
Admission: RE | Admit: 2021-09-05 | Discharge: 2021-09-05 | Disposition: A | Discharge status: Home / Self Care | Payer: BC Managed Care – PPO | Source: Ambulatory Visit | Attending: Sports Medicine | Admitting: Sports Medicine

## 2021-09-05 ENCOUNTER — Other Ambulatory Visit: Payer: Self-pay | Admitting: Sports Medicine

## 2021-09-05 DIAGNOSIS — M545 Low back pain, unspecified: Secondary | ICD-10-CM | POA: Diagnosis not present

## 2021-11-01 DIAGNOSIS — Z13220 Encounter for screening for lipoid disorders: Secondary | ICD-10-CM | POA: Diagnosis not present

## 2021-11-01 DIAGNOSIS — E559 Vitamin D deficiency, unspecified: Secondary | ICD-10-CM | POA: Diagnosis not present

## 2021-11-01 DIAGNOSIS — Z0000 Encounter for general adult medical examination without abnormal findings: Secondary | ICD-10-CM | POA: Diagnosis not present

## 2021-11-30 HISTORY — PX: ROOT CANAL: SHX2363

## 2021-12-02 ENCOUNTER — Ambulatory Visit (INDEPENDENT_AMBULATORY_CARE_PROVIDER_SITE_OTHER): Payer: BC Managed Care – PPO | Admitting: Family Medicine

## 2021-12-02 ENCOUNTER — Other Ambulatory Visit: Payer: Self-pay

## 2021-12-02 ENCOUNTER — Encounter: Payer: Self-pay | Admitting: Family Medicine

## 2021-12-02 VITALS — BP 114/60 | HR 100 | Temp 98.3°F | Resp 12 | Ht 61.0 in | Wt 124.8 lb

## 2021-12-02 DIAGNOSIS — J3089 Other allergic rhinitis: Secondary | ICD-10-CM

## 2021-12-02 DIAGNOSIS — Z0182 Encounter for allergy testing: Secondary | ICD-10-CM

## 2021-12-02 DIAGNOSIS — T781XXA Other adverse food reactions, not elsewhere classified, initial encounter: Secondary | ICD-10-CM | POA: Diagnosis not present

## 2021-12-02 DIAGNOSIS — K9049 Malabsorption due to intolerance, not elsewhere classified: Secondary | ICD-10-CM

## 2021-12-02 DIAGNOSIS — L239 Allergic contact dermatitis, unspecified cause: Secondary | ICD-10-CM | POA: Diagnosis not present

## 2021-12-02 DIAGNOSIS — H1045 Other chronic allergic conjunctivitis: Secondary | ICD-10-CM | POA: Diagnosis not present

## 2021-12-02 NOTE — Progress Notes (Signed)
[address] [place] [address] Dept: [phone]  FOLLOW UP NOTE  Patient ID: Eileen Cherry, female    DOB: 1980/12/02  Age: 41 y.o. MRN: [id number] Date of Office Visit: 12/02/2021  Assessment  Chief Complaint: Allergy Testing (Food: cinnamon bites from [employer], Ice cream and milk shakes, brown rice, brussels sprouts, bambu, pretzel, almonds, protein powder or iron supplements, spicy foods - cause GI issues, sharp hard stabbing sensation in the stomach) and Other (Having skin issues currently. Had been diagnosed with eczema and then undiagnosed.)  HPI Eileen Cherry is a 41 year old female who presents the clinic for a follow-up visit.  She was last seen in this clinic 01/31/2021 for patch reading that was positive to nickel.  Prior to that visit she was seen on 01/11/2021 by Dr. Dellis Anes for evaluation of allergic rhinitis, allergic conjunctivitis, and atopic dermatitis.  In the interim, she presented to the ED on 08/11/2021 for a bilateral pustular rash around both eyes and on both eyelids for which she received Bactrim, erythromycin, and Lotemax with relief of symptoms.  At today's visit, she reports that she is interested in testing for food allergy due to GI symptoms for the last several years.  She reports that she has recently cut out some foods with no relief of abdominal symptoms including bamboo, Brussels sprouts, brown rice, cows milk, and cinnamon.  That even after cutting these foods out of her diet she continues to experience abdominal stabbing and squeezing pain as well as diarrhea.  She denies ever experiencing cardiopulmonary or integumentary symptoms with the abdominal symptoms.  She reports that she began to experience 3 raised bumps on her right hand which lasted for 2 to 3 months before resolution and then she experienced 3 raised bumps on her left hand which also lasted 2 to 3 months.  She did have patch testing to chemicals with a positive reaction to nickel sulfate  on 01/31/2021.  She denies symptoms of reflux including heartburn and vomiting and is not currently taking a medication to control reflux.  She does not have a gastrointestinal specialist at this time.  Allergic rhinitis is reported as well controlled with no medical intervention at this time.  Allergic conjunctivitis is reported as moderately well controlled with Zerviate as needed.  Her current medications are listed in the chart. Of note, food allergy and food intolerance discussed prior to allergy testing.  Drug Allergies:  Allergies  Allergen Reactions   Pataday [Olopatadine Hcl] Other (See Comments)    Eyes burn   Amoxicillin Rash    Other reaction(s): Rash    Physical Exam: BP 114/60    Pulse 100    Temp 98.3 F (36.8 C) (Temporal)    Resp 12    Ht 5\' 1"  (1.549 m)    Wt 124 lb 12.8 oz (56.6 kg)    SpO2 97%    BMI 23.58 kg/m    Physical Exam Vitals reviewed.  Constitutional:      Appearance: Normal appearance.  HENT:     Head: Normocephalic and atraumatic.     Right Ear: Tympanic membrane normal.     Left Ear: Tympanic membrane normal.     Nose:     Comments: Bilateral nares slightly erythematous with clear nasal drainage noted.  Pharynx slightly erythematous with no exudate.  Ears normal.  Eyes normal. Eyes:     Conjunctiva/sclera: Conjunctivae normal.  Cardiovascular:     Rate and Rhythm: Normal rate and regular rhythm.  Heart sounds: Normal heart sounds. No murmur heard. Pulmonary:     Effort: Pulmonary effort is normal.     Breath sounds: Normal breath sounds.     Comments: Lungs clear to auscultation Musculoskeletal:        General: Normal range of motion.     Cervical back: Normal range of motion and neck supple.  Skin:    General: Skin is warm and dry.  Neurological:     Mental Status: She is alert and oriented to person, place, and time.  Psychiatric:        Mood and Affect: Mood normal.        Behavior: Behavior normal.        Thought Content: Thought  content normal.        Judgment: Judgment normal.    Diagnostics: Skin testing to selected foods was negative with adequate controls  Assessment and Plan: 1. Food intolerance   2. Allergic contact dermatitis, unspecified trigger   3. Other chronic allergic conjunctivitis of both eyes   4. Perennial allergic rhinitis     Patient Instructions  Allergic rhinitis Continue cetirizine 10 mg once a day as needed for runny nose or itch Consider saline nasal rinses as needed for nasal symptoms. Use this before any medicated nasal sprays for best result  Allergic conjunctivitis Some over the counter eye drops include Pataday one drop in each eye once a day as needed for red, itchy eyes OR Zaditor one drop in each eye twice a day as needed for red itchy eyes.  Atopic dermatitis Continue twice a day moisturizing routine  Allergic atopic dermatitis Continue to avoid sources of nickel  Food allergy versus sensitivity Your skin testing was negative to selected foods at today's visit including wheat, cow's milk, egg, pecan, almond, rice, chicken, beef, tomato, onion, cabbage, and corn. Follow-up with your gastroenterologist for any continuing abdominal pain.  Call the clinic if this treatment plan is not working well for you  Follow up in 3 months or sooner if needed.   Return in about 3 months (around 03/01/2022), or if symptoms worsen or fail to improve.    Thank you for the opportunity to care for this patient.  Please do not hesitate to contact me with questions.  Thermon Leyland, FNP [facility]

## 2021-12-02 NOTE — Patient Instructions (Signed)
Allergic rhinitis Continue cetirizine 10 mg once a day as needed for runny nose or itch Consider saline nasal rinses as needed for nasal symptoms. Use this before any medicated nasal sprays for best result  Allergic conjunctivitis Some over the counter eye drops include Pataday one drop in each eye once a day as needed for red, itchy eyes OR Zaditor one drop in each eye twice a day as needed for red itchy eyes.  Atopic dermatitis Continue twice a day moisturizing routine  Allergic atopic dermatitis Continue to avoid sources of nickel  Food allergy versus sensitivity Your skin testing was negative to selected foods at today's visit including wheat, cow's milk, egg, pecan, almond, rice, chicken, beef, tomato, onion, cabbage, and corn. Follow-up with your gastroenterologist for any continuing abdominal pain.  Call the clinic if this treatment plan is not working well for you  Follow up in 3 months or sooner if needed.

## 2022-01-24 IMAGING — CR DG LUMBAR SPINE 2-3V
3 series · 3 of 3 positions shown · non-contrast
Comparison: None.

CLINICAL DATA: Lumbar back pain.

EXAM:
LUMBAR SPINE - 2-3 VIEW

[w lumbar spine ap]
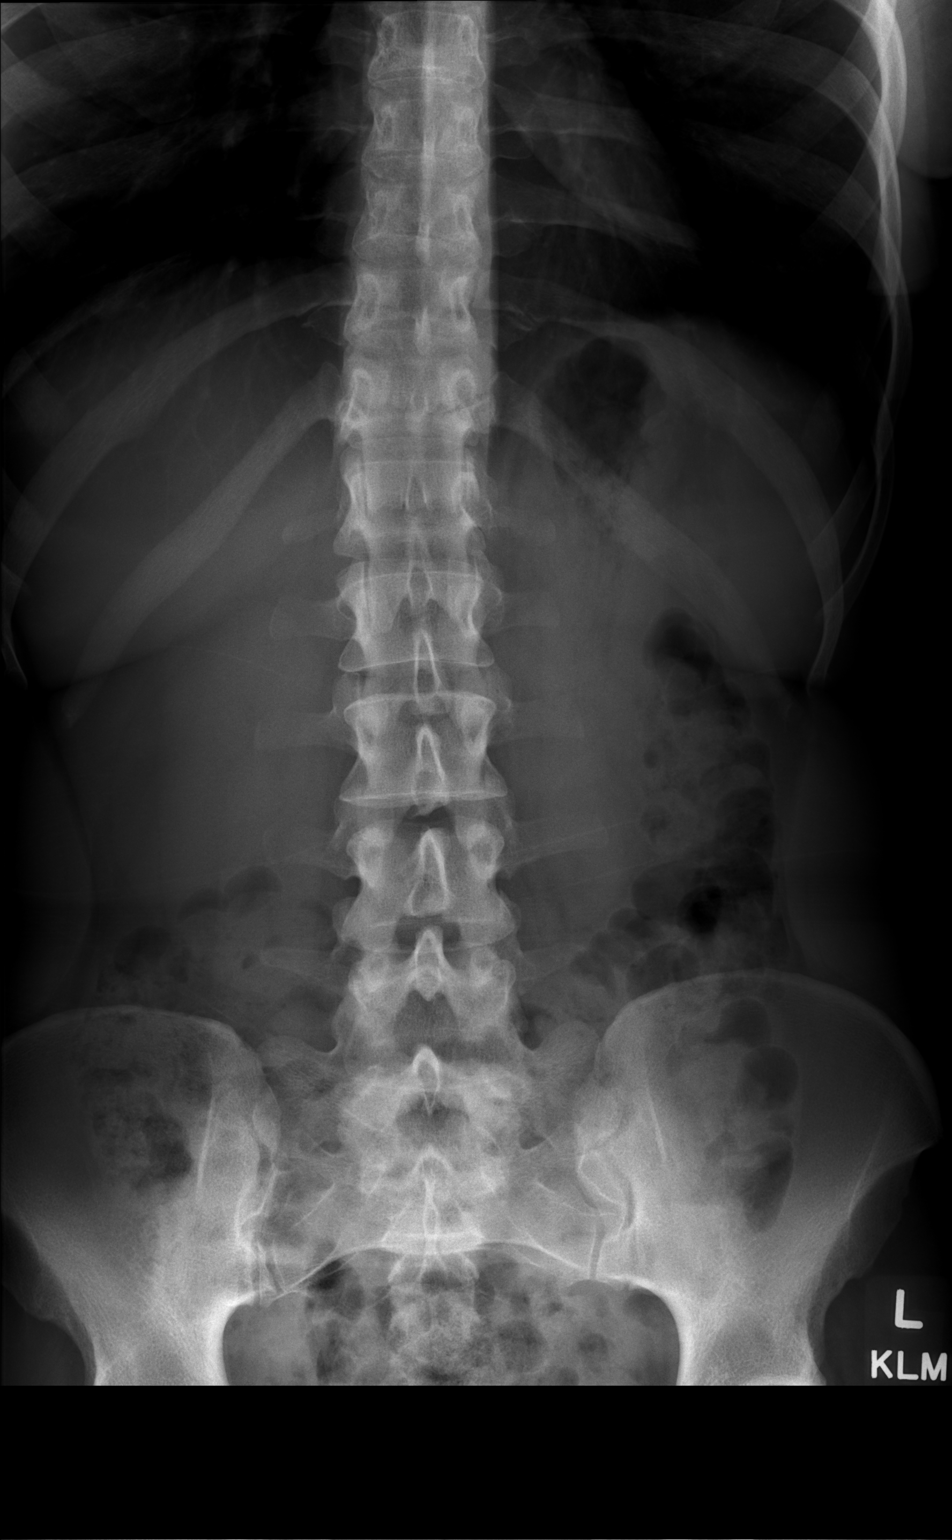

[w lumbar spine lat]
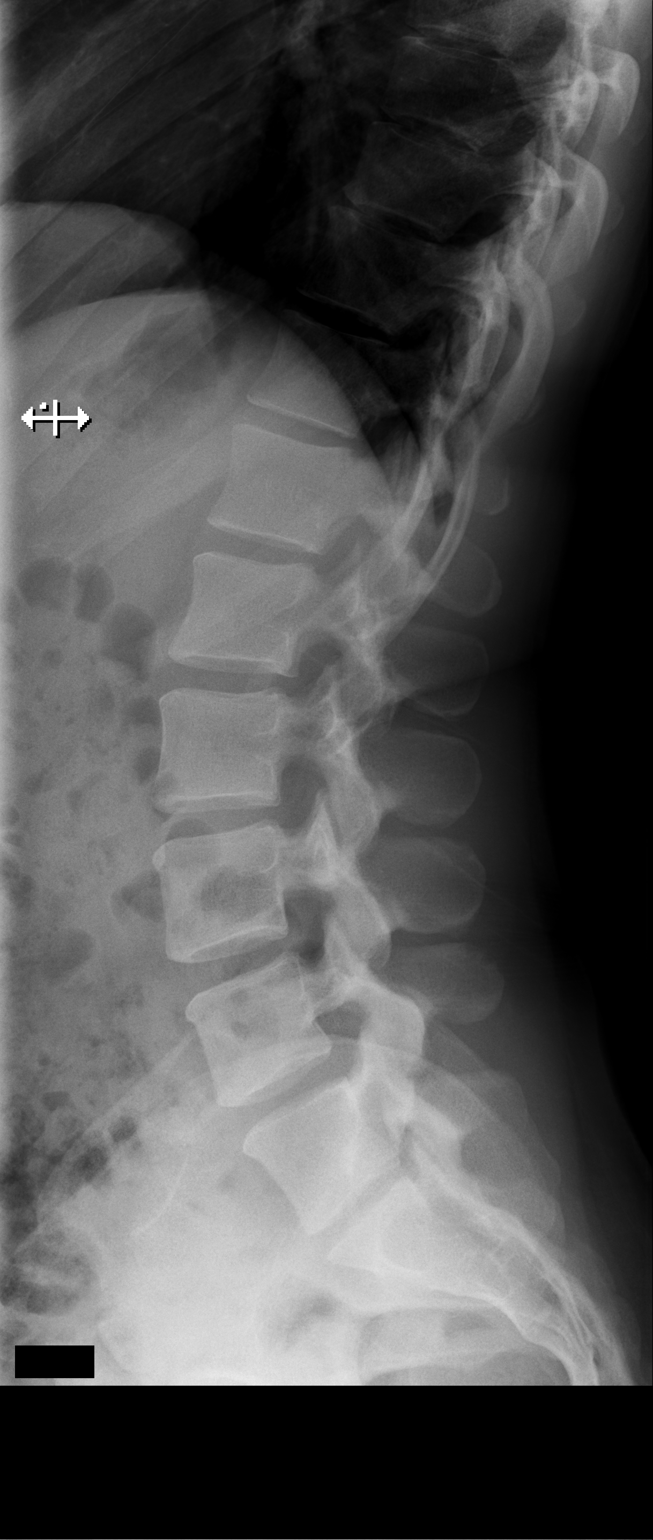

[w lumbar l-5 s-1 spot]
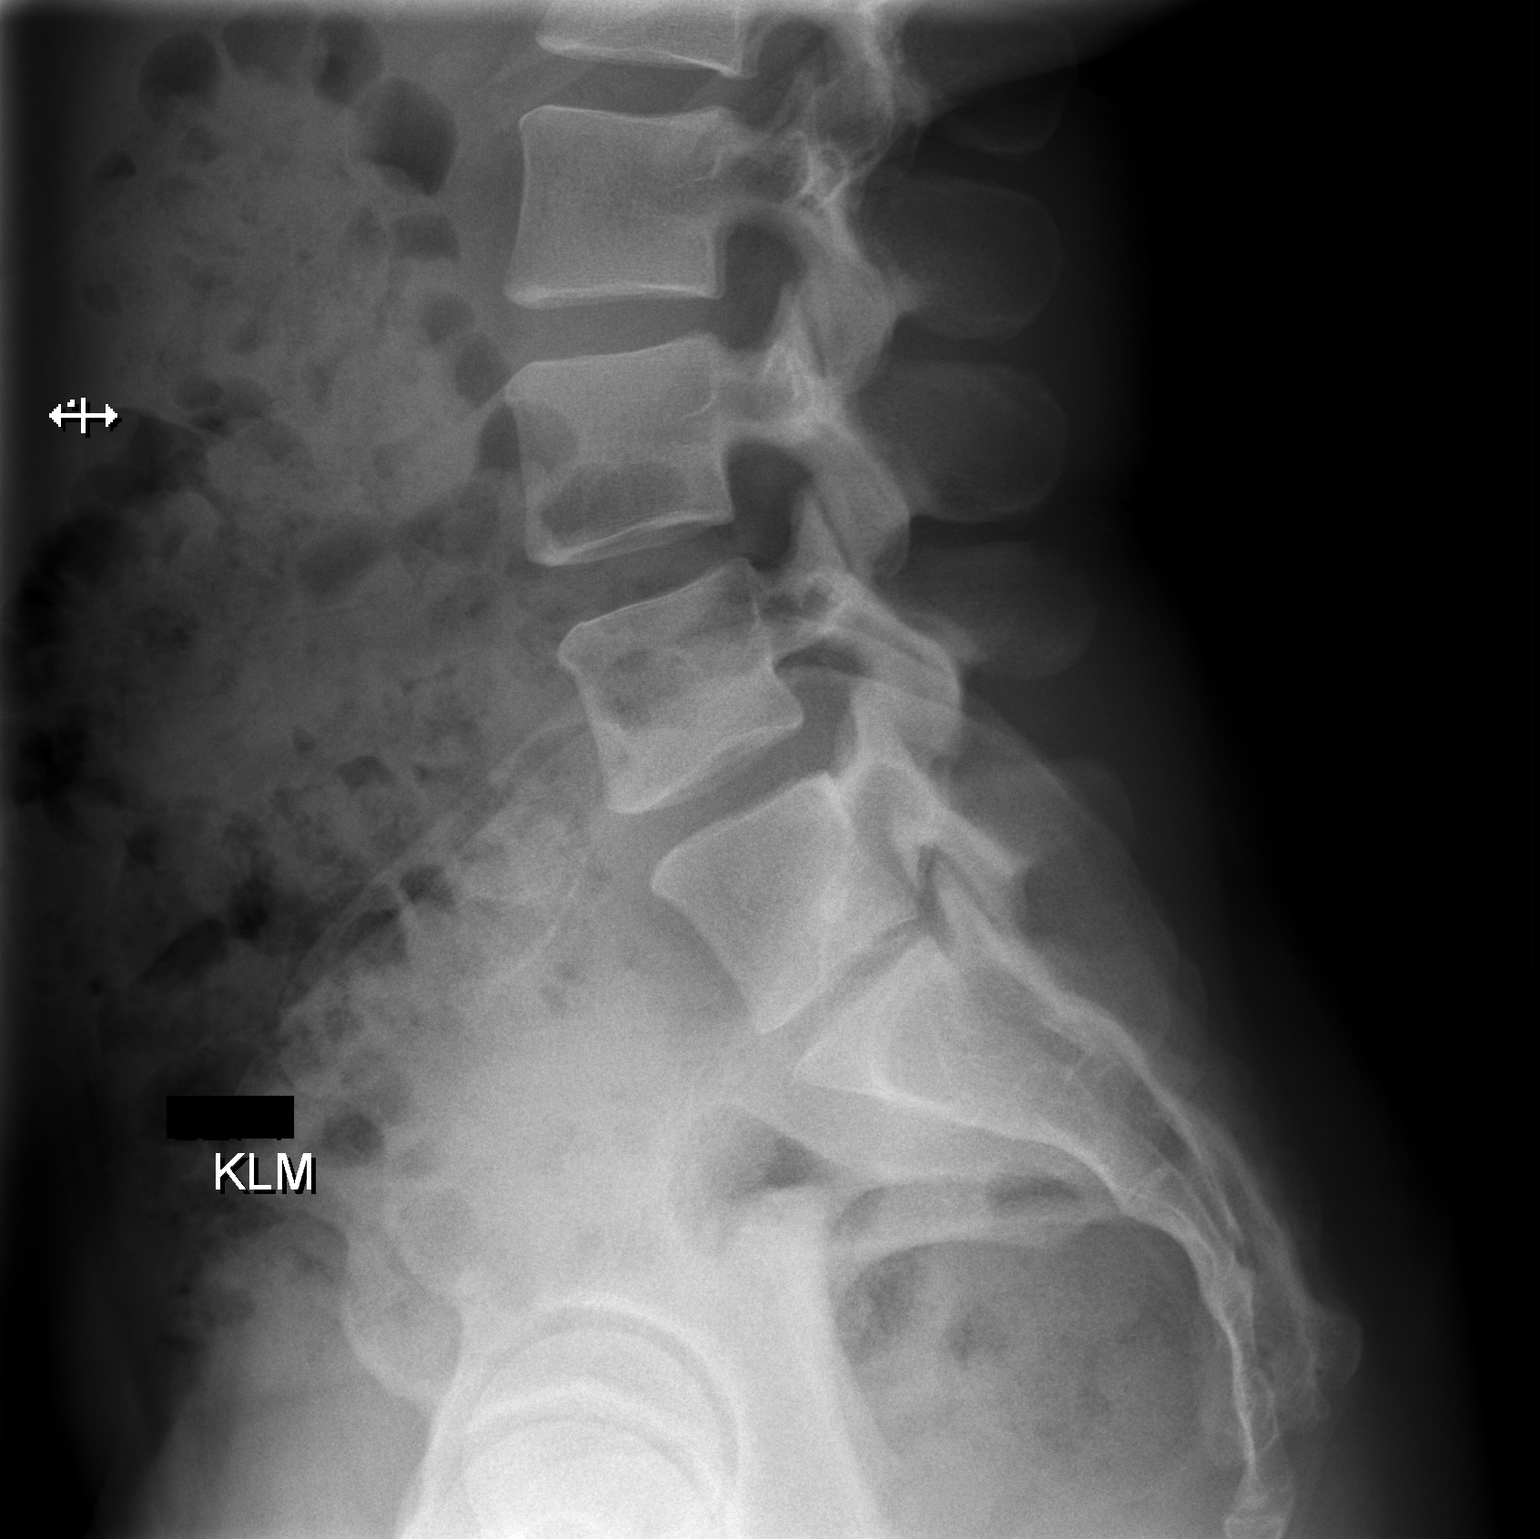

[3 of 3 positions shown; findings below may reference images not displayed]

FINDINGS: There are 6 non-rib-bearing lumbar vertebra with suspected
lumbarization of S1. The lower most fully formed disc space will be
labeled L5-S1. The alignment is maintained. Vertebral body heights
are normal. There is no listhesis. The posterior elements are
intact. Minor L5-S1 disc space narrowing. The remaining disc spaces
are normal. No significant facet changes. No fracture or focal bone
abnormality. Sacroiliac joints are symmetric and normal.
IMPRESSION: Transitional lumbosacral anatomy with suspected lumbarization of S1.
Minor L5-S1 disc space narrowing.

## 2022-02-10 ENCOUNTER — Ambulatory Visit: Payer: BC Managed Care – PPO | Admitting: Family Medicine

## 2022-07-26 ENCOUNTER — Ambulatory Visit
Admission: RE | Admit: 2022-07-26 | Discharge: 2022-07-26 | Disposition: A | Discharge status: Home / Self Care | Payer: Self-pay | Source: Ambulatory Visit | Attending: Chiropractic Medicine | Admitting: Chiropractic Medicine

## 2022-07-26 ENCOUNTER — Other Ambulatory Visit: Payer: Self-pay | Admitting: Chiropractic Medicine

## 2022-07-26 DIAGNOSIS — M545 Low back pain, unspecified: Secondary | ICD-10-CM

## 2022-07-26 DIAGNOSIS — M25551 Pain in right hip: Secondary | ICD-10-CM

## 2024-05-23 ENCOUNTER — Other Ambulatory Visit: Payer: Self-pay | Admitting: Family Medicine

## 2024-05-23 DIAGNOSIS — Z1231 Encounter for screening mammogram for malignant neoplasm of breast: Secondary | ICD-10-CM

## 2024-05-26 ENCOUNTER — Telehealth: Payer: Self-pay

## 2024-05-26 NOTE — Telephone Encounter (Signed)
 Patient telephoned [facility] to schedule appointment. Patient completed screening process and is ineligible.

## 2024-06-04 ENCOUNTER — Ambulatory Visit
Admission: RE | Admit: 2024-06-04 | Discharge: 2024-06-04 | Disposition: A | Discharge status: Home / Self Care | Source: Ambulatory Visit | Attending: Family Medicine | Admitting: Family Medicine

## 2024-06-04 DIAGNOSIS — Z1231 Encounter for screening mammogram for malignant neoplasm of breast: Secondary | ICD-10-CM

## 2024-07-29 ENCOUNTER — Emergency Department (HOSPITAL_COMMUNITY)
Admission: EM | Admit: 2024-07-29 | Discharge: 2024-07-30 | Disposition: A | Discharge status: Home / Self Care | Attending: Emergency Medicine | Admitting: Emergency Medicine

## 2024-07-29 ENCOUNTER — Other Ambulatory Visit: Payer: Self-pay

## 2024-07-29 DIAGNOSIS — T300 Burn of unspecified body region, unspecified degree: Secondary | ICD-10-CM

## 2024-07-29 DIAGNOSIS — X153XXA Contact with hot saucepan or skillet, initial encounter: Secondary | ICD-10-CM | POA: Insufficient documentation

## 2024-07-29 DIAGNOSIS — T23222A Burn of second degree of single left finger (nail) except thumb, initial encounter: Secondary | ICD-10-CM | POA: Insufficient documentation
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
## 2024-07-30 MED ORDER — OXYCODONE HCL 5 MG PO TABS
5.0000 mg | ORAL_TABLET | Freq: Once | ORAL | Status: AC
  Administered 2024-07-30: 5 mg via ORAL
  Filled 2024-07-30: qty 1

## 2024-07-30 MED ORDER — BACITRACIN ZINC 500 UNIT/GM EX OINT
TOPICAL_OINTMENT | Freq: Two times a day (BID) | CUTANEOUS | Status: DC
Start: 2024-07-30 — End: 2024-07-30
  Administered 2024-07-30: 1 via TOPICAL
  Filled 2024-07-30: qty 0.9

## 2024-07-30 MED ORDER — ACETAMINOPHEN 500 MG PO TABS
1000.0000 mg | ORAL_TABLET | Freq: Once | ORAL | Status: AC
  Administered 2024-07-30: 1000 mg via ORAL
  Filled 2024-07-30: qty 2

## 2024-07-30 MED ORDER — SILVER SULFADIAZINE 1 % EX CREA: 1.0000 | TOPICAL_CREAM | Freq: Every day | CUTANEOUS | 0 refills | Status: AC
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
